# Patient Record
Sex: Female | Born: 1950 | Race: White | Hispanic: No | Marital: Married
Health system: Southern US, Community
[De-identification: ages and names within clinical notes are randomized; demographics above are authoritative.]

## PROBLEM LIST (undated history)

## (undated) DIAGNOSIS — J45909 Unspecified asthma, uncomplicated: Secondary | ICD-10-CM

## (undated) DIAGNOSIS — I10 Essential (primary) hypertension: Secondary | ICD-10-CM

## (undated) DIAGNOSIS — I480 Paroxysmal atrial fibrillation: Secondary | ICD-10-CM

## (undated) DIAGNOSIS — G250 Essential tremor: Secondary | ICD-10-CM

## (undated) DIAGNOSIS — N200 Calculus of kidney: Secondary | ICD-10-CM

## (undated) DIAGNOSIS — M1990 Unspecified osteoarthritis, unspecified site: Secondary | ICD-10-CM

## (undated) DIAGNOSIS — E119 Type 2 diabetes mellitus without complications: Secondary | ICD-10-CM

## (undated) DIAGNOSIS — K317 Polyp of stomach and duodenum: Secondary | ICD-10-CM

## (undated) DIAGNOSIS — I2510 Atherosclerotic heart disease of native coronary artery without angina pectoris: Secondary | ICD-10-CM

## (undated) DIAGNOSIS — K760 Fatty (change of) liver, not elsewhere classified: Secondary | ICD-10-CM

## (undated) HISTORY — PX: BREAST BIOPSY: SHX20

## (undated) HISTORY — PX: MENISCECTOMY: SHX123

## (undated) HISTORY — PX: ESOPHAGOGASTRODUODENOSCOPY: SHX1529

## (undated) HISTORY — PX: LEFT HEART CATH: SHX5946

## (undated) HISTORY — PX: FOOT NEUROMA SURGERY: SHX646

## (undated) HISTORY — PX: FOOT SURGERY: SHX648

## (undated) HISTORY — PX: APPENDECTOMY: SHX54

## (undated) HISTORY — PX: CATARACT EXTRACTION W/ INTRAOCULAR LENS IMPLANT: SHX1309

## (undated) HISTORY — PX: MOUTH SURGERY: SHX715

---

## 2004-06-10 ENCOUNTER — Encounter: Admission: RE | Admit: 2004-06-10 | Discharge: 2004-06-10 | Payer: Self-pay | Admitting: Neurosurgery

## 2004-08-11 ENCOUNTER — Ambulatory Visit (HOSPITAL_COMMUNITY): Admission: RE | Admit: 2004-08-11 | Discharge: 2004-08-12 | Payer: Self-pay | Admitting: Neurosurgery

## 2005-09-27 IMAGING — RF IR MYELOGRAM [PERSON_NAME]
12 of 23 series · 12 of 23 positions shown · non-contrast
Comparison: none

CLINICAL DATA: Back and right leg pain.   Neck and left arm pain.

[Series 1: (hospital) · 1 of 1 slices shown]
[im 1/1]
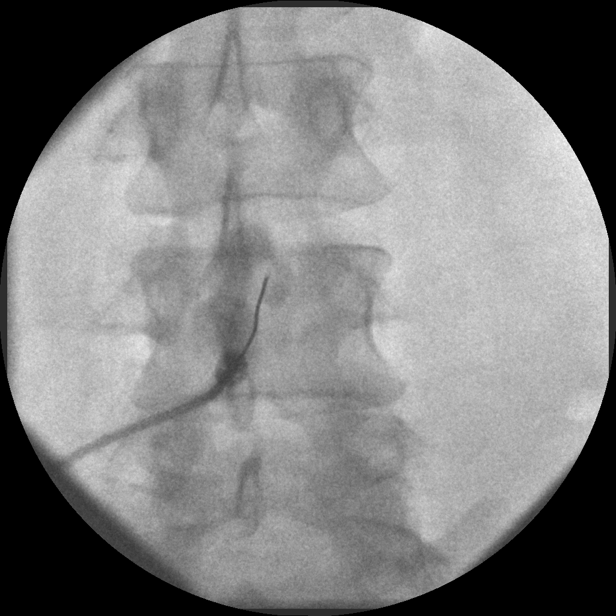

[Series 4: myelogram  white · 1 of 1 slices shown (1 of 11)]
[im 1/1]
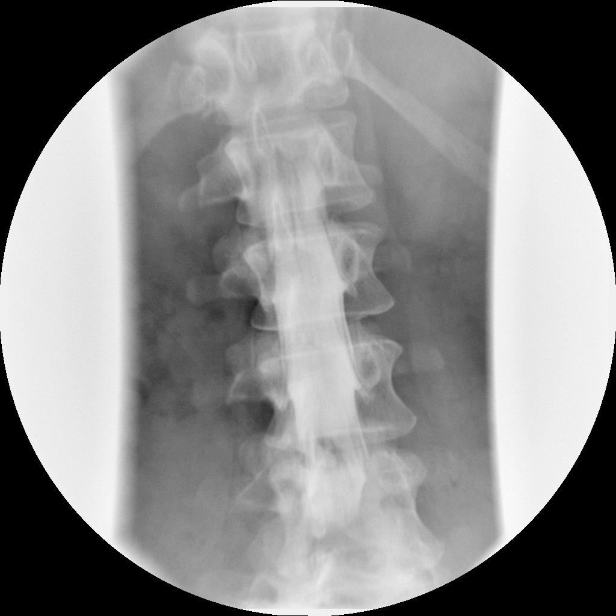

[Series 6: myelogram  white · 1 of 1 slices shown (2 of 11)]
[im 1/1]
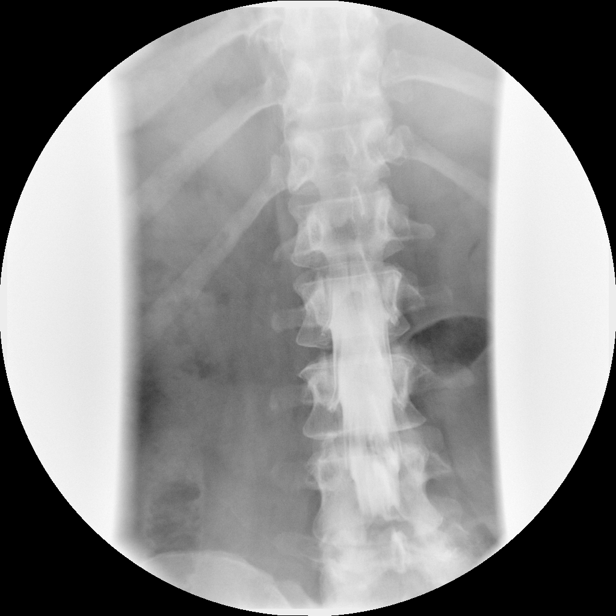

[Series 8: myelogram  white · 1 of 1 slices shown (3 of 11)]
[im 1/1]
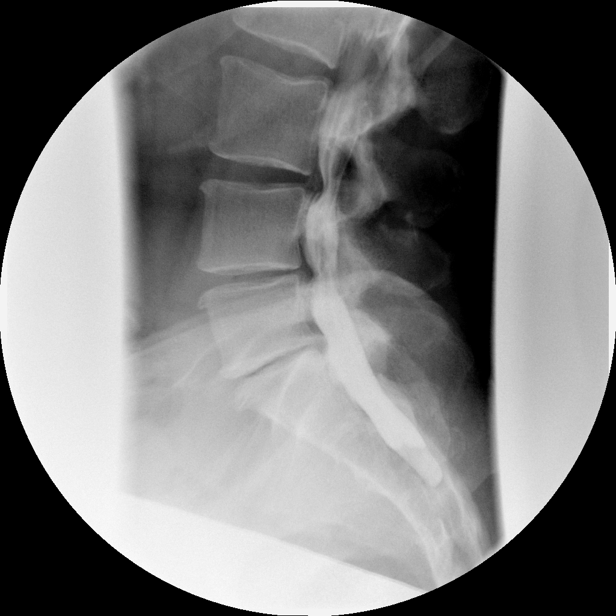

[Series 10: myelogram  white · 1 of 1 slices shown (4 of 11)]
[im 1/1]
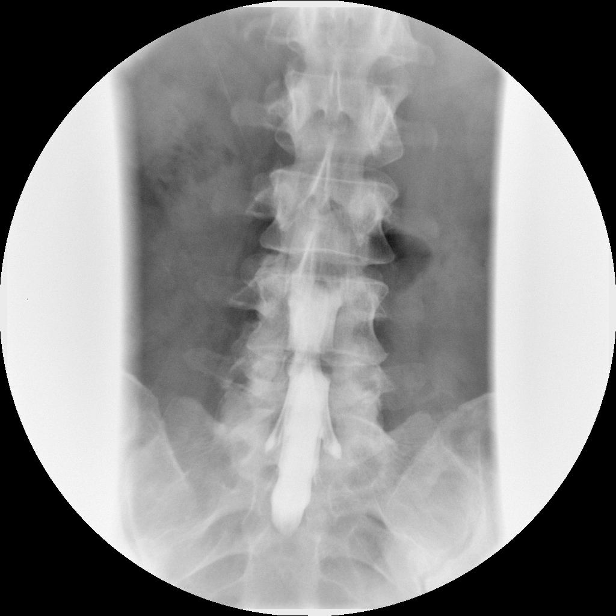

[Series 12: myelogram  white · 1 of 1 slices shown (5 of 11)]
[im 1/1]
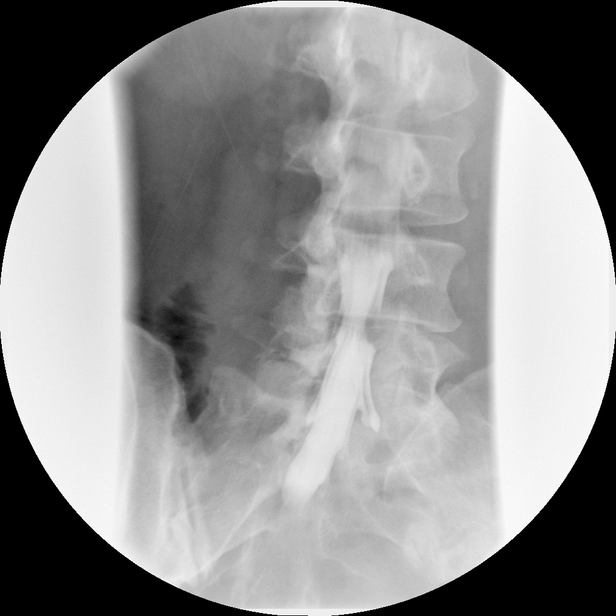

[Series 14: myelogram  white · 1 of 1 slices shown (6 of 11)]
[im 1/1]
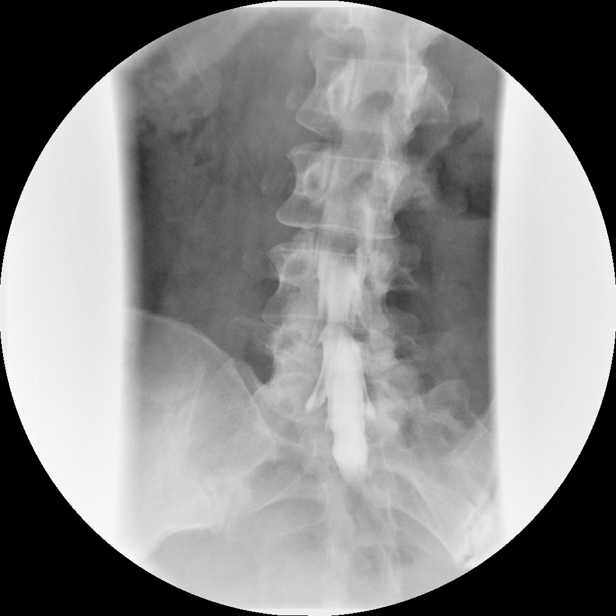

[Series 16: myelogram  white · 1 of 1 slices shown (7 of 11)]
[im 1/1]
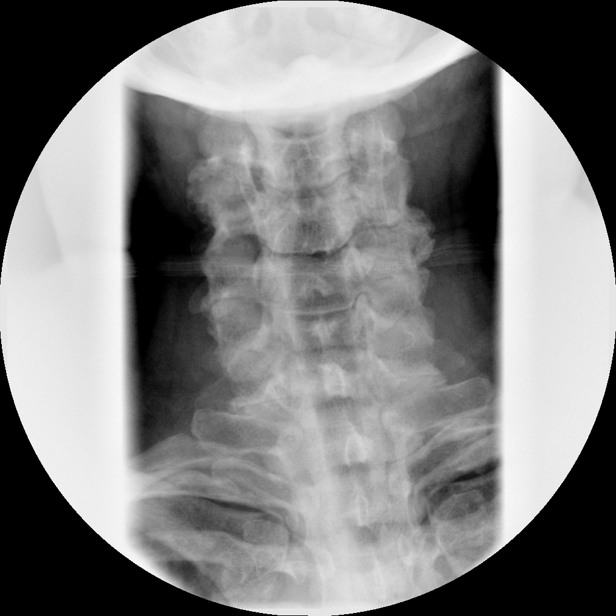

[Series 18: myelogram  white · 1 of 1 slices shown (8 of 11)]
[im 1/1]
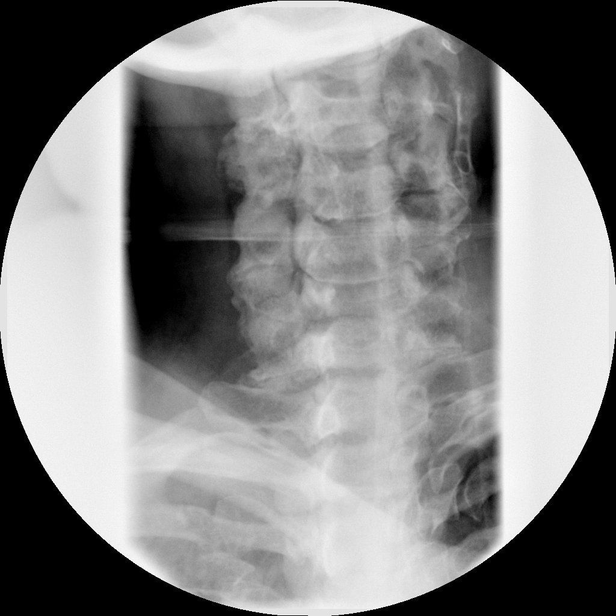

[Series 20: myelogram  white · 1 of 1 slices shown (9 of 11)]
[im 1/1]
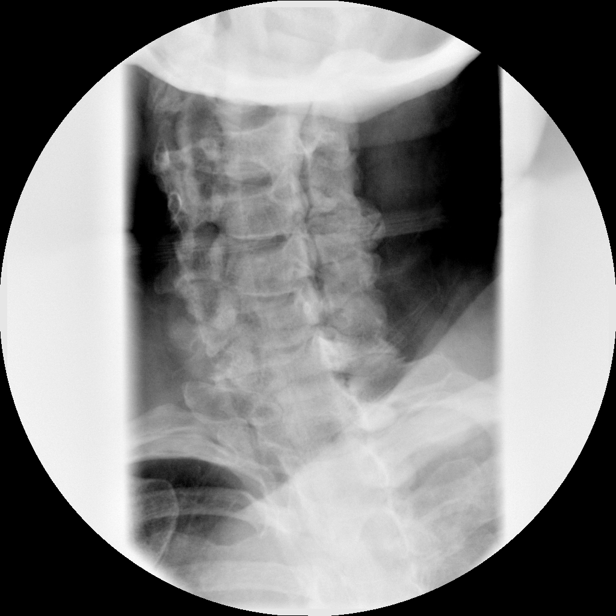

[Series 22: myelogram  white · 1 of 1 slices shown (10 of 11)]
[im 1/1]
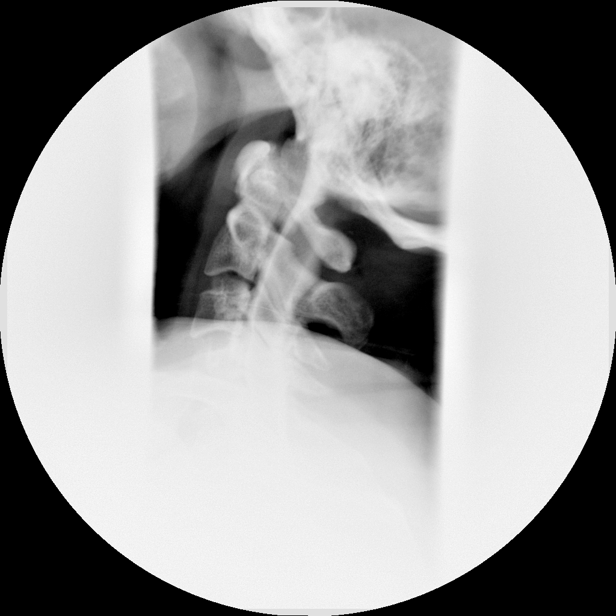

[Series 24: myelogram  white · 1 of 1 slices shown (11 of 11)]
[im 1/1]
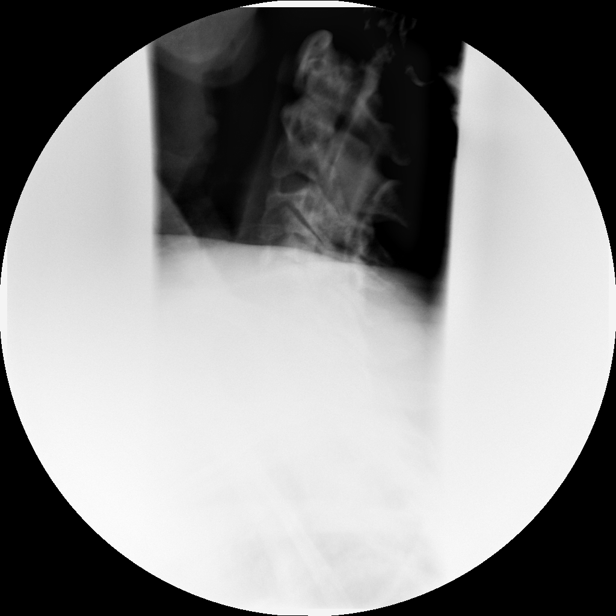

[12 of 23 positions shown; findings below may reference images not displayed]

TOTAL MYELOGRAM, POST-MYELOGRAM   CT OF THE CERVICAL AND LUMBAR REGIONS, AND CT MULTIPLANAR RECONSTRUCTIONS 

 TOTAL MYELOGRAM 

 Following informed consent, sterile preparation of the back and local anesthesia, lumbar puncture was performed at L3-4 from a right paramedian approach.  Fluid was clear and colorless.  10 cc of Omnipaque 300 was instilled into the subarachnoid space.  AP, lateral, and oblique views demonstrate significant waist-like narrowing at L4-5 due to a combination of anterior and posterior compressive elements.  There appear to be large soft disc protrusions at L3-4 and L4-5 ventrally with a smaller ventral defect at L5-S1.  The L5-S1 disc space is narrowed.  There is bilateral L-5 nerve root encroachment right greater than left.  There is mild effacement of both L-4 nerve roots.  I do not detect significant S-1 nerve root compromise. 

 IMPRESSION

 As above. 

 Contrast was maneuvered into the cervical subarachnoid space.  AP, lateral and oblique views demonstrate significant nerve root encroachment at C6-7 on the left which would be consistent with left C-7 radiculopathy.  Similar less severe changes are noted on the right. 

 IMPRESSION

 As above.

 POST-MYELOGRAM CT OF THE LUMBAR SPINE 

 Individual disc spaces are examined as follows:

 L1-2:  Conus slightly low.  No disc protrusion or spinal stenosis. 

 L2-3:  Normal interspace.

 L3-4:  Broad based central disc protrusion.  Mild bilateral L-4 nerve root encroachment.

 L4-5:  Moderate disc space narrowing.  Large central protrusion with posterior element hypertrophy.  Bilateral L-5 nerve root encroachment right greater than left.

 L5-S1:  Disc space narrowing, vacuum disc phenomenon and small central protrusion with biforaminal extension, left greater than right.  Bilateral L-5 nerve root encroachment can be seen in the foramina.  There is no definite S-1 nerve root encroachment in the canal.  

 IMPRESSION

 Conus slightly low. 

 Large central protrusion L4-5 with bilateral L-5 nerve root encroachment.

 Similar smaller protrusion L3-4 with bilateral L-4 nerve root compromise.

 Disc space narrowing with broad based disc protrusion at L5-S1 with biforaminal narrowing compromising both L-5 nerve roots, worse on the left.

 Lower lumbar facet arthropathy and ligamentum flavum hypertrophy, worst at L4-5.

 CT MULTIPLANAR RECONSTRUCTION OF THE LUMBAR SPINE 

 Multiplanar reformatted CT images were reconstructed from the axial CT data set. These images were reviewed and pertinent findings are included in the accompanying complete CT report. 

 IMPRESSION

 See complete CT report. 

 CT CERVICAL SPINE 

 Individual disc spaces are examined as follows:

 C2-3:  Normal interspace.

 C3-4:  Markedly asymmetric facet arthropathy on the left with mild left-sided uncinate hypertrophy.  Left C-4 nerve root encroachment is present in the foramen.  No soft protrusion is seen.

 C4-5:  Asymmetric facet arthropathy, right.  No definite C-5 nerve root compromise.  There is no spinal stenosis or cord compression. 

 C5-6:  Mild bilateral facet arthropathy.  Mild spondylosis with central osteophyte formation.  There is asymmetric uncinate hypertrophy on the right with possible soft disc protrusion (right C-6 nerve root encroachment is present).

 C6-7:  Marked disc space narrowing and central osteophyte formation.  Superimposed uncinate hypertrophy on the left and possible soft disc protrusion results in left C-7 nerve root encroachment.  

 C7-T1:  Normal interspace.

 IMPRESSION

 The most significant left-sided pathology appears to be at C3-4 and C6-7 where a combination of bony overgrowth and possible soft disc material combine to result in C-4 and C-7 nerve root encroachment respectively. 

 Advanced spondylosis at C5-6 with biforaminal narrowing right greater than left; right C-6 nerve root encroachment is suspected.  

 Asymmetric facet arthropathy C4-5, right.

CT MULTIPLANAR RECONSTRUCTION OF THE CERVICAL SPINE 

 Multiplanar reformatted CT images were reconstructed from the axial CT data set. These images were reviewed and pertinent findings are included in the accompanying complete CT report. 

 IMPRESSION

 See complete CT report.

## 2015-09-26 HISTORY — PX: EYE SURGERY: SHX253

## 2016-06-16 ENCOUNTER — Other Ambulatory Visit: Payer: Self-pay | Admitting: Surgery

## 2016-06-16 DIAGNOSIS — R921 Mammographic calcification found on diagnostic imaging of breast: Secondary | ICD-10-CM

## 2016-06-21 ENCOUNTER — Encounter: Payer: Self-pay | Admitting: Radiology

## 2016-06-21 ENCOUNTER — Ambulatory Visit
Admission: RE | Admit: 2016-06-21 | Discharge: 2016-06-21 | Disposition: A | Discharge status: Home / Self Care | Payer: BC Managed Care – PPO | Source: Ambulatory Visit | Attending: Surgery | Admitting: Surgery

## 2016-06-21 DIAGNOSIS — R921 Mammographic calcification found on diagnostic imaging of breast: Secondary | ICD-10-CM

## 2016-06-21 HISTORY — PX: BREAST BIOPSY: SHX20

## 2017-05-29 ENCOUNTER — Other Ambulatory Visit: Payer: Self-pay | Admitting: Obstetrics and Gynecology

## 2017-05-29 ENCOUNTER — Other Ambulatory Visit: Payer: Self-pay | Admitting: Surgery

## 2017-05-29 DIAGNOSIS — Z1231 Encounter for screening mammogram for malignant neoplasm of breast: Secondary | ICD-10-CM

## 2017-06-05 ENCOUNTER — Ambulatory Visit
Admission: RE | Admit: 2017-06-05 | Discharge: 2017-06-05 | Disposition: A | Discharge status: Home / Self Care | Payer: Medicare Other | Source: Ambulatory Visit | Attending: Obstetrics and Gynecology | Admitting: Obstetrics and Gynecology

## 2017-06-05 DIAGNOSIS — Z1231 Encounter for screening mammogram for malignant neoplasm of breast: Secondary | ICD-10-CM

## 2017-06-07 ENCOUNTER — Ambulatory Visit: Payer: BC Managed Care – PPO

## 2017-09-03 HISTORY — PX: TOTAL KNEE ARTHROPLASTY: SHX125

## 2018-06-13 ENCOUNTER — Other Ambulatory Visit: Payer: Self-pay | Admitting: Obstetrics and Gynecology

## 2018-06-13 DIAGNOSIS — Z1231 Encounter for screening mammogram for malignant neoplasm of breast: Secondary | ICD-10-CM

## 2018-06-18 ENCOUNTER — Ambulatory Visit
Admission: RE | Admit: 2018-06-18 | Discharge: 2018-06-18 | Disposition: A | Discharge status: Home / Self Care | Payer: Medicare Other | Source: Ambulatory Visit | Attending: Obstetrics and Gynecology | Admitting: Obstetrics and Gynecology

## 2018-06-18 DIAGNOSIS — Z1231 Encounter for screening mammogram for malignant neoplasm of breast: Secondary | ICD-10-CM

## 2018-07-17 ENCOUNTER — Ambulatory Visit: Payer: Medicare Other

## 2019-05-26 ENCOUNTER — Other Ambulatory Visit: Payer: Self-pay | Admitting: Obstetrics and Gynecology

## 2019-05-26 DIAGNOSIS — Z1231 Encounter for screening mammogram for malignant neoplasm of breast: Secondary | ICD-10-CM

## 2019-07-14 ENCOUNTER — Other Ambulatory Visit: Payer: Self-pay

## 2019-07-14 ENCOUNTER — Ambulatory Visit
Admission: RE | Admit: 2019-07-14 | Discharge: 2019-07-14 | Disposition: A | Discharge status: Home / Self Care | Payer: Medicare Other | Source: Ambulatory Visit | Attending: Obstetrics and Gynecology | Admitting: Obstetrics and Gynecology

## 2019-07-14 DIAGNOSIS — Z1231 Encounter for screening mammogram for malignant neoplasm of breast: Secondary | ICD-10-CM

## 2019-10-15 ENCOUNTER — Ambulatory Visit: Payer: Medicare PPO | Attending: Internal Medicine

## 2019-10-15 ENCOUNTER — Other Ambulatory Visit: Payer: Self-pay

## 2019-10-15 DIAGNOSIS — Z23 Encounter for immunization: Secondary | ICD-10-CM

## 2019-10-15 NOTE — Progress Notes (Signed)
   Covid-19 Vaccination Clinic  Name:  Laurie Kelly    MRN: [id number] DOB: 04-02-1951  10/15/2019  [name] was observed post Covid-19 immunization for 15 minutes without incidence. She was provided with Vaccine Information Sheet and instruction to access the V-Safe system.   [name] was instructed to call 911 with any severe reactions post vaccine: Marland Kitchen Difficulty breathing  . Swelling of your face and throat  . A fast heartbeat  . A bad rash all over your body  . Dizziness and weakness    Immunizations Administered    Name Date Dose VIS Date Route   Pfizer COVID-19 Vaccine 10/15/2019  2:29 PM 0.3 mL 09/05/2019 Intramuscular   Manufacturer: [employer], [employer]   Lot: [id number]   NDC: [address]-4

## 2019-10-25 ENCOUNTER — Ambulatory Visit: Payer: Medicare PPO

## 2019-11-05 ENCOUNTER — Ambulatory Visit: Payer: Medicare PPO

## 2019-11-05 ENCOUNTER — Ambulatory Visit: Payer: Medicare PPO | Attending: Internal Medicine

## 2019-11-05 DIAGNOSIS — Z23 Encounter for immunization: Secondary | ICD-10-CM | POA: Insufficient documentation

## 2019-11-05 NOTE — Progress Notes (Signed)
   Covid-19 Vaccination Clinic  Name:  Laurie Kelly    MRN: [id number] DOB: December 16, 1950  11/05/2019  Laurie Kelly was observed post Covid-19 immunization for 15 minutes without incidence. She was provided with Vaccine Information Sheet and instruction to access the V-Safe system.   Laurie Kelly was instructed to call 911 with any severe reactions post vaccine: Marland Kitchen Difficulty breathing  . Swelling of your face and throat  . A fast heartbeat  . A bad rash all over your body  . Dizziness and weakness    Immunizations Administered    Name Date Dose VIS Date Route   Pfizer COVID-19 Vaccine 11/05/2019 12:50 PM 0.3 mL 09/05/2019 Intramuscular   Manufacturer: [employer], [employer]   Lot: [id number]   NDC: [address]-3

## 2020-05-11 HISTORY — PX: CORONARY ANGIOPLASTY WITH STENT PLACEMENT: SHX49

## 2020-06-04 ENCOUNTER — Other Ambulatory Visit: Payer: Self-pay | Admitting: Obstetrics and Gynecology

## 2020-06-04 DIAGNOSIS — Z1231 Encounter for screening mammogram for malignant neoplasm of breast: Secondary | ICD-10-CM

## 2020-06-28 ENCOUNTER — Ambulatory Visit: Payer: Medicare PPO

## 2020-07-14 ENCOUNTER — Ambulatory Visit
Admission: RE | Admit: 2020-07-14 | Discharge: 2020-07-14 | Disposition: A | Discharge status: Home / Self Care | Payer: Medicare PPO | Source: Ambulatory Visit | Attending: Obstetrics and Gynecology | Admitting: Obstetrics and Gynecology

## 2020-07-14 ENCOUNTER — Other Ambulatory Visit: Payer: Self-pay

## 2020-07-14 DIAGNOSIS — Z1231 Encounter for screening mammogram for malignant neoplasm of breast: Secondary | ICD-10-CM

## 2020-07-15 ENCOUNTER — Ambulatory Visit: Payer: Medicare PPO

## 2020-07-22 ENCOUNTER — Ambulatory Visit: Payer: Medicare PPO

## 2021-06-27 ENCOUNTER — Other Ambulatory Visit: Payer: Self-pay | Admitting: Obstetrics and Gynecology

## 2021-06-27 DIAGNOSIS — Z1231 Encounter for screening mammogram for malignant neoplasm of breast: Secondary | ICD-10-CM

## 2021-07-18 ENCOUNTER — Other Ambulatory Visit: Payer: Self-pay

## 2021-07-18 ENCOUNTER — Ambulatory Visit
Admission: RE | Admit: 2021-07-18 | Discharge: 2021-07-18 | Disposition: A | Discharge status: Home / Self Care | Payer: Medicare PPO | Source: Ambulatory Visit | Attending: Obstetrics and Gynecology | Admitting: Obstetrics and Gynecology

## 2021-07-18 DIAGNOSIS — Z1231 Encounter for screening mammogram for malignant neoplasm of breast: Secondary | ICD-10-CM

## 2021-07-21 ENCOUNTER — Ambulatory Visit: Payer: Medicare PPO

## 2022-07-10 ENCOUNTER — Other Ambulatory Visit: Payer: Self-pay | Admitting: Obstetrics and Gynecology

## 2022-07-17 ENCOUNTER — Other Ambulatory Visit: Payer: Self-pay | Admitting: Obstetrics and Gynecology

## 2022-07-17 DIAGNOSIS — N644 Mastodynia: Secondary | ICD-10-CM

## 2022-08-03 ENCOUNTER — Ambulatory Visit
Admission: RE | Admit: 2022-08-03 | Discharge: 2022-08-03 | Disposition: A | Discharge status: Home / Self Care | Payer: Medicare PPO | Source: Ambulatory Visit | Attending: Obstetrics and Gynecology | Admitting: Obstetrics and Gynecology

## 2022-08-03 DIAGNOSIS — N644 Mastodynia: Secondary | ICD-10-CM

## 2022-08-30 HISTORY — PX: CARPAL TUNNEL RELEASE: SHX101

## 2022-08-30 HISTORY — PX: OTHER SURGICAL HISTORY: SHX169

## 2023-07-25 ENCOUNTER — Other Ambulatory Visit: Payer: Self-pay | Admitting: Obstetrics and Gynecology

## 2023-07-25 DIAGNOSIS — Z1231 Encounter for screening mammogram for malignant neoplasm of breast: Secondary | ICD-10-CM

## 2023-09-04 ENCOUNTER — Ambulatory Visit
Admission: RE | Admit: 2023-09-04 | Discharge: 2023-09-04 | Disposition: A | Discharge status: Home / Self Care | Payer: Medicare PPO | Source: Ambulatory Visit | Attending: Obstetrics and Gynecology | Admitting: Obstetrics and Gynecology

## 2023-09-04 DIAGNOSIS — Z1231 Encounter for screening mammogram for malignant neoplasm of breast: Secondary | ICD-10-CM

## 2024-08-04 ENCOUNTER — Other Ambulatory Visit: Payer: Self-pay | Admitting: Internal Medicine

## 2024-08-04 DIAGNOSIS — Z1231 Encounter for screening mammogram for malignant neoplasm of breast: Secondary | ICD-10-CM

## 2024-08-13 ENCOUNTER — Other Ambulatory Visit: Payer: Self-pay | Admitting: Orthopedic Surgery

## 2024-09-08 ENCOUNTER — Ambulatory Visit
Admission: RE | Admit: 2024-09-08 | Discharge: 2024-09-08 | Disposition: A | Source: Ambulatory Visit | Attending: Internal Medicine | Admitting: Internal Medicine

## 2024-09-08 DIAGNOSIS — Z1231 Encounter for screening mammogram for malignant neoplasm of breast: Secondary | ICD-10-CM

## 2024-09-29 ENCOUNTER — Encounter (HOSPITAL_BASED_OUTPATIENT_CLINIC_OR_DEPARTMENT_OTHER): Payer: Self-pay | Admitting: Orthopedic Surgery

## 2024-09-29 ENCOUNTER — Other Ambulatory Visit: Payer: Self-pay

## 2024-09-30 ENCOUNTER — Encounter (HOSPITAL_BASED_OUTPATIENT_CLINIC_OR_DEPARTMENT_OTHER)
Admission: RE | Admit: 2024-09-30 | Discharge: 2024-09-30 | Disposition: A | Discharge status: Home / Self Care | Source: Ambulatory Visit | Attending: Orthopedic Surgery | Admitting: Orthopedic Surgery

## 2024-09-30 DIAGNOSIS — Z01810 Encounter for preprocedural cardiovascular examination: Secondary | ICD-10-CM | POA: Diagnosis present

## 2024-09-30 NOTE — Progress Notes (Signed)

## 2024-10-01 LAB — BASIC METABOLIC PANEL WITH GFR
Anion gap: 12 (ref 5–15)
BUN: 32 mg/dL — ABNORMAL HIGH (ref 8–23)
CO2: 25 mmol/L (ref 22–32)
Calcium: 10 mg/dL (ref 8.9–10.3)
Chloride: 102 mmol/L (ref 98–111)
Creatinine, Ser: 1.07 mg/dL — ABNORMAL HIGH (ref 0.44–1.00)
GFR, Estimated: 55 mL/min — ABNORMAL LOW
Glucose, Bld: 164 mg/dL — ABNORMAL HIGH (ref 70–99)
Potassium: 4.9 mmol/L (ref 3.5–5.1)
Sodium: 138 mmol/L (ref 135–145)

## 2024-10-06 ENCOUNTER — Other Ambulatory Visit: Payer: Self-pay

## 2024-10-06 ENCOUNTER — Ambulatory Visit (HOSPITAL_BASED_OUTPATIENT_CLINIC_OR_DEPARTMENT_OTHER): Admission: RE | Admit: 2024-10-06 | Source: Home / Self Care | Admitting: Orthopedic Surgery

## 2024-10-06 ENCOUNTER — Encounter (HOSPITAL_BASED_OUTPATIENT_CLINIC_OR_DEPARTMENT_OTHER): Payer: Self-pay | Admitting: Orthopedic Surgery

## 2024-10-06 ENCOUNTER — Ambulatory Visit (HOSPITAL_BASED_OUTPATIENT_CLINIC_OR_DEPARTMENT_OTHER): Admitting: Anesthesiology

## 2024-10-06 ENCOUNTER — Encounter (HOSPITAL_BASED_OUTPATIENT_CLINIC_OR_DEPARTMENT_OTHER)
Admission: RE | Disposition: A | Discharge status: Home / Self Care | Payer: Self-pay | Source: Home / Self Care | Attending: Orthopedic Surgery

## 2024-10-06 DIAGNOSIS — J45909 Unspecified asthma, uncomplicated: Secondary | ICD-10-CM | POA: Diagnosis not present

## 2024-10-06 DIAGNOSIS — Z7985 Long-term (current) use of injectable non-insulin antidiabetic drugs: Secondary | ICD-10-CM | POA: Diagnosis not present

## 2024-10-06 DIAGNOSIS — I2510 Atherosclerotic heart disease of native coronary artery without angina pectoris: Secondary | ICD-10-CM | POA: Diagnosis not present

## 2024-10-06 DIAGNOSIS — Z955 Presence of coronary angioplasty implant and graft: Secondary | ICD-10-CM | POA: Diagnosis not present

## 2024-10-06 DIAGNOSIS — Z79899 Other long term (current) drug therapy: Secondary | ICD-10-CM | POA: Diagnosis not present

## 2024-10-06 DIAGNOSIS — I10 Essential (primary) hypertension: Secondary | ICD-10-CM | POA: Diagnosis not present

## 2024-10-06 DIAGNOSIS — I4891 Unspecified atrial fibrillation: Secondary | ICD-10-CM | POA: Insufficient documentation

## 2024-10-06 DIAGNOSIS — Z01818 Encounter for other preprocedural examination: Secondary | ICD-10-CM

## 2024-10-06 DIAGNOSIS — G5602 Carpal tunnel syndrome, left upper limb: Secondary | ICD-10-CM | POA: Diagnosis not present

## 2024-10-06 DIAGNOSIS — E119 Type 2 diabetes mellitus without complications: Secondary | ICD-10-CM | POA: Diagnosis not present

## 2024-10-06 DIAGNOSIS — I480 Paroxysmal atrial fibrillation: Secondary | ICD-10-CM | POA: Diagnosis not present

## 2024-10-06 DIAGNOSIS — Z7984 Long term (current) use of oral hypoglycemic drugs: Secondary | ICD-10-CM | POA: Insufficient documentation

## 2024-10-06 HISTORY — DX: Unspecified asthma, uncomplicated: J45.909

## 2024-10-06 HISTORY — DX: Type 2 diabetes mellitus without complications: E11.9

## 2024-10-06 HISTORY — DX: Atherosclerotic heart disease of native coronary artery without angina pectoris: I25.10

## 2024-10-06 HISTORY — DX: Fatty (change of) liver, not elsewhere classified: K76.0

## 2024-10-06 HISTORY — DX: Polyp of stomach and duodenum: K31.7

## 2024-10-06 HISTORY — DX: Essential (primary) hypertension: I10

## 2024-10-06 HISTORY — DX: Essential tremor: G25.0

## 2024-10-06 HISTORY — DX: Unspecified osteoarthritis, unspecified site: M19.90

## 2024-10-06 HISTORY — DX: Paroxysmal atrial fibrillation: I48.0

## 2024-10-06 HISTORY — PX: CARPAL TUNNEL RELEASE: SHX101

## 2024-10-06 HISTORY — DX: Calculus of kidney: N20.0

## 2024-10-06 LAB — GLUCOSE, CAPILLARY
Glucose-Capillary: 146 mg/dL — ABNORMAL HIGH (ref 70–99)
Glucose-Capillary: 110 mg/dL — ABNORMAL HIGH (ref 70–99)

## 2024-10-06 SURGERY — CARPAL TUNNEL RELEASE
Anesthesia: General | Site: Hand | Laterality: Left

## 2024-10-06 MED ORDER — FENTANYL CITRATE (PF) 100 MCG/2ML IJ SOLN
INTRAMUSCULAR | Status: DC | PRN
  Administered 2024-10-06: 50 ug via INTRAVENOUS
  Administered 2024-10-06 (×2): 25 ug via INTRAVENOUS

## 2024-10-06 MED ORDER — LACTATED RINGERS IV SOLN: INTRAVENOUS | Status: DC

## 2024-10-06 MED ORDER — 0.9 % SODIUM CHLORIDE (POUR BTL) OPTIME
TOPICAL | Status: DC | PRN
  Administered 2024-10-06: 75 mL

## 2024-10-06 MED ORDER — ATROPINE SULFATE (PF) 0.4 MG/ML IJ SOLN
INTRAMUSCULAR | Status: AC
  Filled 2024-10-06: qty 1

## 2024-10-06 MED ORDER — LIDOCAINE HCL (CARDIAC) PF 100 MG/5ML IV SOSY
PREFILLED_SYRINGE | INTRAVENOUS | Status: DC | PRN
  Administered 2024-10-06: 80 mg via INTRAVENOUS

## 2024-10-06 MED ORDER — PHENYLEPHRINE 80 MCG/ML (10ML) SYRINGE FOR IV PUSH (FOR BLOOD PRESSURE SUPPORT)
PREFILLED_SYRINGE | INTRAVENOUS | Status: AC
  Filled 2024-10-06: qty 10

## 2024-10-06 MED ORDER — ONDANSETRON HCL 4 MG/2ML IJ SOLN
INTRAMUSCULAR | Status: DC | PRN
  Administered 2024-10-06: 4 mg via INTRAVENOUS

## 2024-10-06 MED ORDER — ACETAMINOPHEN 500 MG PO TABS
ORAL_TABLET | ORAL | Status: AC
  Filled 2024-10-06: qty 2

## 2024-10-06 MED ORDER — BUPIVACAINE HCL (PF) 0.25 % IJ SOLN
INTRAMUSCULAR | Status: DC | PRN
  Administered 2024-10-06: 9 mL

## 2024-10-06 MED ORDER — ACETAMINOPHEN 500 MG PO TABS: 1000.0000 mg | ORAL_TABLET | Freq: Once | ORAL | Status: AC

## 2024-10-06 MED ORDER — DEXAMETHASONE SOD PHOSPHATE PF 10 MG/ML IJ SOLN
INTRAMUSCULAR | Status: AC
  Filled 2024-10-06: qty 3

## 2024-10-06 MED ORDER — HYDROCODONE-ACETAMINOPHEN 5-325 MG PO TABS: 1.0000 | ORAL_TABLET | Freq: Four times a day (QID) | ORAL | 0 refills | Status: AC | PRN

## 2024-10-06 MED ORDER — CEFAZOLIN SODIUM-DEXTROSE 2-4 GM/100ML-% IV SOLN
2.0000 g | INTRAVENOUS | Status: AC
  Administered 2024-10-06: 2 g via INTRAVENOUS

## 2024-10-06 MED ORDER — EPHEDRINE SULFATE (PRESSORS) 25 MG/5ML IV SOSY
PREFILLED_SYRINGE | INTRAVENOUS | Status: DC | PRN
  Administered 2024-10-06: 5 mg via INTRAVENOUS
  Administered 2024-10-06 (×2): 10 mg via INTRAVENOUS

## 2024-10-06 MED ORDER — CEFAZOLIN SODIUM-DEXTROSE 2-4 GM/100ML-% IV SOLN
INTRAVENOUS | Status: AC
  Filled 2024-10-06: qty 100

## 2024-10-06 MED ORDER — LIDOCAINE 2% (20 MG/ML) 5 ML SYRINGE
INTRAMUSCULAR | Status: AC
  Filled 2024-10-06: qty 20

## 2024-10-06 MED ORDER — PROPOFOL 10 MG/ML IV BOLUS
INTRAVENOUS | Status: DC | PRN
  Administered 2024-10-06: 50 mg via INTRAVENOUS
  Administered 2024-10-06: 150 mg via INTRAVENOUS
  Administered 2024-10-06 (×2): 50 mg via INTRAVENOUS

## 2024-10-06 MED ORDER — ACETAMINOPHEN 500 MG PO TABS
1000.0000 mg | ORAL_TABLET | Freq: Once | ORAL | Status: AC
  Administered 2024-10-06: 1000 mg via ORAL

## 2024-10-06 MED ORDER — PHENYLEPHRINE HCL (PRESSORS) 10 MG/ML IV SOLN
INTRAVENOUS | Status: DC | PRN
  Administered 2024-10-06 (×2): 160 ug via INTRAVENOUS

## 2024-10-06 MED ORDER — FENTANYL CITRATE (PF) 100 MCG/2ML IJ SOLN
INTRAMUSCULAR | Status: AC
  Filled 2024-10-06: qty 2

## 2024-10-06 MED ORDER — ONDANSETRON HCL 4 MG/2ML IJ SOLN
INTRAMUSCULAR | Status: AC
  Filled 2024-10-06: qty 6

## 2024-10-06 SURGICAL SUPPLY — 33 items
BLADE SURG 15 STRL LF DISP TIS (BLADE) ×2 IMPLANT
BNDG COMPR ESMARK 4X3 LF (GAUZE/BANDAGES/DRESSINGS) IMPLANT
BNDG ELASTIC 3INX 5YD STR LF (GAUZE/BANDAGES/DRESSINGS) ×1 IMPLANT
BNDG GAUZE DERMACEA FLUFF 4 (GAUZE/BANDAGES/DRESSINGS) ×1 IMPLANT
CHLORAPREP W/TINT 26 (MISCELLANEOUS) ×1 IMPLANT
CORD BIPOLAR FORCEPS 12FT (ELECTRODE) ×1 IMPLANT
COVER BACK TABLE 60X90IN (DRAPES) ×1 IMPLANT
COVER MAYO STAND STRL (DRAPES) ×1 IMPLANT
CUFF TOURN SGL QUICK 18X4 (TOURNIQUET CUFF) ×1 IMPLANT
DRAPE EXTREMITY T 121X128X90 (DISPOSABLE) ×1 IMPLANT
DRAPE SURG 17X23 STRL (DRAPES) ×1 IMPLANT
GAUZE PAD ABD 8X10 STRL (GAUZE/BANDAGES/DRESSINGS) ×1 IMPLANT
GAUZE SPONGE 4X4 12PLY STRL (GAUZE/BANDAGES/DRESSINGS) ×1 IMPLANT
GAUZE XEROFORM 1X8 LF (GAUZE/BANDAGES/DRESSINGS) ×1 IMPLANT
GLOVE BIO SURGEON STRL SZ7.5 (GLOVE) ×1 IMPLANT
GLOVE BIOGEL PI IND STRL 7.0 (GLOVE) IMPLANT
GLOVE BIOGEL PI IND STRL 8 (GLOVE) ×1 IMPLANT
GLOVE SURG SS PI 6.5 STRL IVOR (GLOVE) IMPLANT
GLOVE SURG SS PI 7.0 STRL IVOR (GLOVE) IMPLANT
GLOVE SURG SS PI 7.5 STRL IVOR (GLOVE) IMPLANT
GOWN STRL REUS W/ TWL LRG LVL3 (GOWN DISPOSABLE) ×1 IMPLANT
GOWN STRL REUS W/ TWL XL LVL3 (GOWN DISPOSABLE) IMPLANT
GOWN STRL REUS W/TWL XL LVL3 (GOWN DISPOSABLE) ×1 IMPLANT
NEEDLE HYPO 25X1 1.5 SAFETY (NEEDLE) ×1 IMPLANT
PACK BASIN DAY SURGERY FS (CUSTOM PROCEDURE TRAY) ×1 IMPLANT
PADDING CAST ABS COTTON 4X4 ST (CAST SUPPLIES) ×1 IMPLANT
SOLN 0.9% NACL POUR BTL 1000ML (IV SOLUTION) ×1 IMPLANT
STOCKINETTE 4X48 STRL (DRAPES) ×1 IMPLANT
SUT ETHILON 4 0 PS 2 18 (SUTURE) ×1 IMPLANT
SYR BULB EAR ULCER 3OZ GRN STR (SYRINGE) ×1 IMPLANT
SYR CONTROL 10ML LL (SYRINGE) ×1 IMPLANT
TOWEL GREEN STERILE FF (TOWEL DISPOSABLE) ×2 IMPLANT
UNDERPAD 30X36 HEAVY ABSORB (UNDERPADS AND DIAPERS) ×1 IMPLANT

## 2024-10-06 NOTE — Anesthesia Preprocedure Evaluation (Addendum)
"                                    Anesthesia Evaluation  Patient identified by MRN, date of birth, ID band Patient awake    Reviewed: Allergy & Precautions, Patient's Chart, lab work & pertinent test results, reviewed documented beta blocker date and time   Airway Mallampati: I  TM Distance: >3 FB Neck ROM: Full    Dental no notable dental hx. (+) Teeth Intact, Dental Advisory Given   Pulmonary asthma    Pulmonary exam normal breath sounds clear to auscultation       Cardiovascular hypertension, Pt. on home beta blockers and Pt. on medications + CAD and + Cardiac Stents  Normal cardiovascular exam+ dysrhythmias (eliquis) Atrial Fibrillation  Rhythm:Regular Rate:Normal     Neuro/Psych negative neurological ROS  negative psych ROS   GI/Hepatic negative GI ROS, Neg liver ROS,,,  Endo/Other  diabetes, Type 2, Oral Hypoglycemic Agents    Renal/GU Renal InsufficiencyRenal disease  negative genitourinary   Musculoskeletal  (+) Arthritis ,    Abdominal   Peds  Hematology negative hematology ROS (+)   Anesthesia Other Findings   Reproductive/Obstetrics                              Anesthesia Physical Anesthesia Plan  ASA: 3  Anesthesia Plan: General   Post-op Pain Management: Tylenol  PO (pre-op)*   Induction: Intravenous  PONV Risk Score and Plan: 3 and Ondansetron , Dexamethasone  and Treatment may vary due to age or medical condition  Airway Management Planned: LMA  Additional Equipment:   Intra-op Plan:   Post-operative Plan: Extubation in OR  Informed Consent: I have reviewed the patients History and Physical, chart, labs and discussed the procedure including the risks, benefits and alternatives for the proposed anesthesia with the patient or authorized representative who has indicated his/her understanding and acceptance.     Dental advisory given  Plan Discussed with: CRNA  Anesthesia Plan Comments:           Anesthesia Quick Evaluation  "

## 2024-10-06 NOTE — Op Note (Signed)
 10/06/2024 [facility]                              OPERATIVE REPORT   PREOPERATIVE DIAGNOSIS:  Left carpal tunnel syndrome  POSTOPERATIVE DIAGNOSIS:  Left carpal tunnel syndrome  PROCEDURE:  Left carpal tunnel release  SURGEON:  Franky Curia, MD  ASSISTANT:  Isaiah Anton, [facility]  ANESTHESIA: General  IV FLUIDS:  Per anesthesia flow sheet  ESTIMATED BLOOD LOSS:  Minimal  COMPLICATIONS:  None  SPECIMENS:  None  TOURNIQUET TIME:   Total Tourniquet Time Documented: Upper Arm (Left) - 11 minutes Total: Upper Arm (Left) - 11 minutes   DISPOSITION:  Stable to PACU  LOCATION: [facility]  INDICATIONS:  74 y.o. yo female with numbness and tingling left hand.  Nocturnal symptoms. Positive nerve conduction studies. She wishes to proceed with left carpal tunnel release.  Risks, benefits and alternatives of surgery were discussed including the risk of blood loss; infection; damage to nerves, vessels, tendons, ligaments, bone; failure of surgery; need for additional surgery; complications with wound healing; continued pain; recurrence of carpal tunnel syndrome; and damage to motor branch. She voiced understanding of these risks and elected to proceed.   OPERATIVE COURSE:  After being identified preoperatively by myself, the patient and I agreed upon the procedure and site of procedure.  The surgical site was marked.  Surgical consent had been signed.  She was given IV Ancef  as preoperative antibiotic prophylaxis.  She was transferred to the operating room and placed on the operating room table in supine position with the Left upper extremity on an armboard.  General anesthesia was induced by the anesthesiologist.  Left upper extremity was prepped and draped in normal sterile orthopaedic fashion.  A surgical pause was performed between the surgeons, anesthesia, and operating room staff, and all were in agreement as to the patient, procedure, and site of procedure.   Tourniquet at the proximal aspect of the extremity was inflated to 250 mmHg after exsanguination of the arm with an Esmarch bandage  Incision was made over the transverse carpal ligament and carried into the subcutaneous tissues by spreading technique.  Bipolar electrocautery was used to obtain hemostasis.  The palmar fascia was sharply incised.  The transverse carpal ligament was identified.  The fascia distal to the ligament was opened.  Retractor was placed and the flexor tendons were identified.  The flexor tendon to the ring finger was identified and retracted radially.  The transverse carpal ligament was then incised from distal to proximal under direct visualization.  Scissors were used to split the distal aspect of the volar antebrachial fascia.  A finger was placed into the wound to ensure complete decompression, which was the case.  The nerve was examined.  It was flattened and hyperemic.  The motor branch was identified and was intact.  The wound was copiously irrigated with sterile saline.  It was then closed with 4-0 nylon in a horizontal mattress fashion.  It was injected with 0.25% plain Marcaine  to aid in postoperative analgesia.  It was dressed with sterile Xeroform, 4x4s, an ABD, and wrapped with Kerlix and an Ace bandage.  Tourniquet was deflated at 11 minutes.  Fingertips were pink with brisk capillary refill after deflation of the tourniquet.  Operative drapes were broken down.  The patient was awoken from anesthesia safely.  She was transferred back to stretcher and taken to the PACU in stable condition.  I will see her back in the office in 1 week for postoperative followup.  I will give her a prescription for Norco 5/325 1 tab PO q6 hours prn pain, dispense #15.    Clevland Cork, MD Electronically signed, 10/06/2024

## 2024-10-06 NOTE — Discharge Instructions (Addendum)
 NO tylenol  until 6:45 p.m.   [facility] Instructions Hand Surgery  Wound Care: Keep your hand elevated above the level of your heart.  Do not allow it to dangle by your side.  Keep the dressing dry and do not remove it unless your doctor advises you to do so.  He will usually change it at the time of your post-op visit.  Moving your fingers is advised to stimulate circulation but will depend on the site of your surgery.  If you have a splint applied, your doctor will advise you regarding movement.  Activity: Do not drive or operate machinery today.  Rest today and then you may return to your normal activity and work as indicated by your physician.  Diet:  Drink liquids today or eat a light diet.  You may resume a regular diet tomorrow.    General expectations: Pain for two to three days. Fingers may become slightly swollen.  Call your doctor if any of the following occur: Severe pain not relieved by pain medication. Elevated temperature. Dressing soaked with blood. Inability to move fingers. White or bluish color to fingers.    Post Anesthesia Home Care Instructions  Activity: Get plenty of rest for the remainder of the day. A responsible individual must stay with you for 24 hours following the procedure.  For the next 24 hours, DO NOT: -Drive a car -Advertising copywriter -Drink alcoholic beverages -Take any medication unless instructed by your physician -Make any legal decisions or sign important papers.  Meals: Start with liquid foods such as gelatin or soup. Progress to regular foods as tolerated. Avoid greasy, spicy, heavy foods. If nausea and/or vomiting occur, drink only clear liquids until the nausea and/or vomiting subsides. Call your physician if vomiting continues.  Special Instructions/Symptoms: Your throat may feel dry or sore from the anesthesia or the breathing tube placed in your throat during surgery. If this causes discomfort, gargle with warm salt water. The  discomfort should disappear within 24 hours.  If you had a scopolamine patch placed behind your ear for the management of post- operative nausea and/or vomiting:  1. The medication in the patch is effective for 72 hours, after which it should be removed.  Wrap patch in a tissue and discard in the trash. Wash hands thoroughly with soap and water. 2. You may remove the patch earlier than 72 hours if you experience unpleasant side effects which may include dry mouth, dizziness or visual disturbances. 3. Avoid touching the patch. Wash your hands with soap and water after contact with the patch.

## 2024-10-06 NOTE — Transfer of Care (Signed)
 Immediate Anesthesia Transfer of Care Note  Patient: Laurie Kelly  Procedure(s) Performed: LEFT CARPAL TUNNEL RELEASE (Left: Hand)  Patient Location: PACU  Anesthesia Type:General  Level of Consciousness: awake and patient cooperative  Airway & Oxygen Therapy: Patient Spontanous Breathing and Patient connected to nasal cannula oxygen  Post-op Assessment: Report given to RN and Post -op Vital signs reviewed and stable  Post vital signs: Reviewed and stable  Last Vitals:  Vitals Value Taken Time  BP    Temp    Pulse 69 10/06/24 14:43  Resp    SpO2 96 % 10/06/24 14:43  Vitals shown include unfiled device data.  Last Pain:  Vitals:   10/06/24 1240  TempSrc: Temporal  PainSc: 0-No pain         Complications: No notable events documented.

## 2024-10-06 NOTE — H&P (Signed)
 " Laurie Kelly is an 74 y.o. female.   Chief Complaint: carpal tunnel syndrome HPI: 74 y.o. yo female with numbness and tingling left hand.  Nocturnal symptoms. Positive nerve conduction studies. She wishes to have left carpal tunnel release.   Allergies: Allergies[1]  Past Medical History:  Diagnosis Date   Arthritis    Asthma    Coronary artery disease    Coronary artery disease involving native coronary artery of native heart with unstable angina pectoris   Diabetes mellitus without complication (HCC)    Essential tremor    Fatty liver    Hypertension    Kidney stones    Multiple gastric polyps    Followed by Dr. Daria Kelly   Paroxysmal atrial fibrillation [facility])     Past Surgical History:  Procedure Laterality Date   APPENDECTOMY     BREAST BIOPSY Right 06/21/2016   CARPAL TUNNEL RELEASE Right 08/30/2022   CATARACT EXTRACTION W/ INTRAOCULAR LENS IMPLANT     CORONARY ANGIOPLASTY WITH STENT PLACEMENT  05/11/2020   CUBITAL TUNNEL RELEASE Right 08/30/2022   ESOPHAGOGASTRODUODENOSCOPY     EYE SURGERY  2017   FOOT NEUROMA SURGERY     FOOT SURGERY     LEFT HEART CATH     MENISCECTOMY     MOUTH SURGERY     TOTAL KNEE ARTHROPLASTY  09/03/2017    Family History: Family History  Problem Relation Age of Onset   Breast cancer Neg Hx     Social History:   reports that she has never smoked. She has never used smokeless tobacco. She reports that she does not currently use alcohol. She reports that she does not use drugs.  Medications: Medications Prior to Admission  Medication Sig Dispense Refill   albuterol (VENTOLIN HFA) 108 (90 Base) MCG/ACT inhaler Inhale 1-2 puffs into the lungs every 6 (six) hours as needed for wheezing or shortness of breath.     apixaban (ELIQUIS) 5 MG TABS tablet Take 5 mg by mouth 2 (two) times daily.     aspirin EC 81 MG tablet Take 81 mg by mouth daily. Swallow whole.     Bempedoic Acid 180 MG TABS Take by mouth.     empagliflozin  (JARDIANCE) 25 MG TABS tablet Take 25 mg by mouth daily.     ezetimibe (ZETIA) 10 MG tablet Take 10 mg by mouth daily.     fluticasone (FLONASE) 50 MCG/ACT nasal spray Place into both nostrils daily.     losartan (COZAAR) 25 MG tablet Take 25 mg by mouth daily.     metoprolol succinate (TOPROL-XL) 50 MG 24 hr tablet Take 50 mg by mouth daily. Take with or immediately following a meal.     tirzepatide (MOUNJARO) 5 MG/0.5ML Pen Inject 5 mg into the skin once a week.     Vitamin D, Ergocalciferol, (DRISDOL) 1.25 MG (50000 UNIT) CAPS capsule Take 50,000 Units by mouth every 7 (seven) days.     chlorzoxazone (PARAFON) 500 MG tablet Take 500 mg by mouth 4 (four) times daily as needed for muscle spasms.     estradiol (VIVELLE-DOT) 0.1 MG/24HR patch Place 1 patch onto the skin 2 (two) times a week.      Results for orders placed or performed during the hospital encounter of 10/06/24 (from the past 48 hours)  Glucose, capillary     Status: Abnormal   Collection Time: 10/06/24 12:43 PM  Result Value Ref Range   Glucose-Capillary 146 (H) 70 - 99 mg/dL  Comment: Glucose reference range applies only to samples taken after fasting for at least 8 hours.    No results found.    Blood pressure 118/88, pulse 64, temperature (!) 97.3 F (36.3 C), temperature source Temporal, resp. rate 16, height 5' 7 (1.702 m), weight 96.7 kg, SpO2 99%.  General appearance: alert, cooperative, and appears stated age Head: Normocephalic, without obvious abnormality, atraumatic Neck: supple, symmetrical, trachea midline Extremities: Intact capillary refill all digits.  +epl/fpl/io.  No wounds.  Skin: Skin color, texture, turgor normal. No rashes or lesions Neurologic: Grossly normal Incision/Wound: none  Assessment/Plan Left carpal tunnel syndrome.  Non operative and operative treatment options have been discussed with the patient and patient wishes to proceed with operative treatment. Risks, benefits, and  alternatives of surgery have been discussed and the patient agrees with the plan of care.   Laurie Kelly 10/06/2024, 12:55 PM     [1]  Allergies Allergen Reactions   Evolocumab Shortness Of Breath   Banana Hives   Lisinopril Cough   Penicillins Hives   Pravastatin     Arthralgias, Myalgias   Ranitidine     Abdominal Pain   Rosuvastatin    Wound Dressing Adhesive Dermatitis   Macrobid [Nitrofurantoin] Rash   "

## 2024-10-06 NOTE — Anesthesia Procedure Notes (Signed)
 Procedure Name: LMA Insertion Date/Time: 10/06/2024 2:11 PM  Performed by: Donnell Berwyn SQUIBB, CRNAPre-anesthesia Checklist: Suction available, Emergency Drugs available, Patient identified, Patient being monitored and Timeout performed Patient Re-evaluated:Patient Re-evaluated prior to induction Oxygen Delivery Method: Circle system utilized Preoxygenation: Pre-oxygenation with 100% oxygen Induction Type: IV induction Ventilation: Mask ventilation without difficulty LMA: LMA inserted LMA Size: 4.0 Number of attempts: 1 Placement Confirmation: positive ETCO2 and breath sounds checked- equal and bilateral Tube secured with: Tape Dental Injury: Teeth and Oropharynx as per pre-operative assessment

## 2024-10-06 NOTE — Op Note (Signed)
 SURGERY ASSISTANT NOTE  PLACE OF SERVICE: [facility]   PATIENT INFORMATION: Name: Laurie Kelly MRN#: [id number] DOB: Feb 24, 1951  Date of surgery: 10/06/2024 Time of surgery: 2:36 PM  SURGERY ASSISTANT NOTE:  Assistant name: Isaiah Anton, PA-C Note date: 10/06/2024  I assisted Dr. Franky Curia on the following procedure(s) for the above-noted patient in the date and time documented:   Left carpal tunnel release   I provided assistance on the case as follows:  Assistance with exposure, retraction, bleeding control, protection of vital structures, instrumentation  and closure.   Isaiah Anton, PA-C

## 2024-10-06 NOTE — Anesthesia Postprocedure Evaluation (Signed)
"   Anesthesia Post Note  Patient: Laurie Kelly  Procedure(s) Performed: LEFT CARPAL TUNNEL RELEASE (Left: Hand)     Patient location during evaluation: PACU Anesthesia Type: General Level of consciousness: awake and alert Pain management: pain level controlled Vital Signs Assessment: post-procedure vital signs reviewed and stable Respiratory status: spontaneous breathing, nonlabored ventilation, respiratory function stable and patient connected to nasal cannula oxygen Cardiovascular status: blood pressure returned to baseline and stable Postop Assessment: no apparent nausea or vomiting Anesthetic complications: no   No notable events documented.  Last Vitals:  Vitals:   10/06/24 1445 10/06/24 1500  BP: (!) 116/58 (!) 113/53  Pulse: 68 66  Resp: 13 12  Temp:    SpO2: 97% 95%    Last Pain:  Vitals:   10/06/24 1500  TempSrc:   PainSc: 0-No pain                 Laurie Kelly      "

## 2024-10-07 ENCOUNTER — Encounter (HOSPITAL_BASED_OUTPATIENT_CLINIC_OR_DEPARTMENT_OTHER): Payer: Self-pay | Admitting: Orthopedic Surgery
